# Patient Record
Sex: Male | Born: 1970 | Race: White | Hispanic: No | Marital: Married | Smoking: Current every day smoker
Health system: Southern US, Community
[De-identification: ages and names within clinical notes are randomized; demographics above are authoritative.]

## PROBLEM LIST (undated history)

## (undated) DIAGNOSIS — F32A Depression, unspecified: Secondary | ICD-10-CM

## (undated) DIAGNOSIS — T148XXA Other injury of unspecified body region, initial encounter: Secondary | ICD-10-CM

## (undated) DIAGNOSIS — F419 Anxiety disorder, unspecified: Secondary | ICD-10-CM

## (undated) DIAGNOSIS — F329 Major depressive disorder, single episode, unspecified: Secondary | ICD-10-CM

## (undated) DIAGNOSIS — H548 Legal blindness, as defined in USA: Secondary | ICD-10-CM

## (undated) HISTORY — PX: ABDOMINAL SURGERY: SHX537

---

## 2005-05-26 ENCOUNTER — Emergency Department (HOSPITAL_COMMUNITY): Admission: EM | Admit: 2005-05-26 | Discharge: 2005-05-26 | Payer: Self-pay | Admitting: *Deleted

## 2008-12-26 ENCOUNTER — Emergency Department (HOSPITAL_COMMUNITY): Admission: EM | Admit: 2008-12-26 | Discharge: 2008-12-26 | Payer: Self-pay | Admitting: Emergency Medicine

## 2009-01-06 ENCOUNTER — Emergency Department (HOSPITAL_COMMUNITY): Admission: EM | Admit: 2009-01-06 | Discharge: 2009-01-06 | Payer: Self-pay | Admitting: Emergency Medicine

## 2009-02-03 ENCOUNTER — Emergency Department (HOSPITAL_COMMUNITY): Admission: EM | Admit: 2009-02-03 | Discharge: 2009-02-03 | Payer: Self-pay | Admitting: Emergency Medicine

## 2010-02-02 ENCOUNTER — Ambulatory Visit: Payer: Self-pay | Admitting: Cardiology

## 2013-10-27 ENCOUNTER — Encounter (HOSPITAL_COMMUNITY): Payer: Self-pay | Admitting: Emergency Medicine

## 2013-10-27 ENCOUNTER — Ambulatory Visit (HOSPITAL_COMMUNITY)
Admission: RE | Admit: 2013-10-27 | Discharge: 2013-10-27 | Disposition: A | Discharge status: Home / Self Care | Payer: Medicaid Other | Source: Ambulatory Visit | Attending: *Deleted | Admitting: *Deleted

## 2013-10-27 ENCOUNTER — Other Ambulatory Visit (HOSPITAL_COMMUNITY): Payer: Self-pay | Admitting: *Deleted

## 2013-10-27 ENCOUNTER — Emergency Department (HOSPITAL_COMMUNITY)
Admission: EM | Admit: 2013-10-27 | Discharge: 2013-10-27 | Disposition: A | Discharge status: Home / Self Care | Payer: Medicaid Other | Attending: Emergency Medicine | Admitting: Emergency Medicine

## 2013-10-27 DIAGNOSIS — M51379 Other intervertebral disc degeneration, lumbosacral region without mention of lumbar back pain or lower extremity pain: Secondary | ICD-10-CM | POA: Insufficient documentation

## 2013-10-27 DIAGNOSIS — F17200 Nicotine dependence, unspecified, uncomplicated: Secondary | ICD-10-CM | POA: Insufficient documentation

## 2013-10-27 DIAGNOSIS — M545 Low back pain, unspecified: Secondary | ICD-10-CM | POA: Insufficient documentation

## 2013-10-27 DIAGNOSIS — Z8659 Personal history of other mental and behavioral disorders: Secondary | ICD-10-CM | POA: Insufficient documentation

## 2013-10-27 DIAGNOSIS — S0003XA Contusion of scalp, initial encounter: Secondary | ICD-10-CM | POA: Insufficient documentation

## 2013-10-27 DIAGNOSIS — M25559 Pain in unspecified hip: Secondary | ICD-10-CM

## 2013-10-27 DIAGNOSIS — S0083XA Contusion of other part of head, initial encounter: Secondary | ICD-10-CM | POA: Insufficient documentation

## 2013-10-27 DIAGNOSIS — S1093XA Contusion of unspecified part of neck, initial encounter: Principal | ICD-10-CM

## 2013-10-27 DIAGNOSIS — M5137 Other intervertebral disc degeneration, lumbosacral region: Secondary | ICD-10-CM | POA: Insufficient documentation

## 2013-10-27 DIAGNOSIS — H548 Legal blindness, as defined in USA: Secondary | ICD-10-CM | POA: Insufficient documentation

## 2013-10-27 HISTORY — DX: Other injury of unspecified body region, initial encounter: T14.8XXA

## 2013-10-27 HISTORY — DX: Legal blindness, as defined in USA: H54.8

## 2013-10-27 HISTORY — DX: Depression, unspecified: F32.A

## 2013-10-27 HISTORY — DX: Major depressive disorder, single episode, unspecified: F32.9

## 2013-10-27 HISTORY — DX: Anxiety disorder, unspecified: F41.9

## 2013-10-27 MED ORDER — OXYCODONE-ACETAMINOPHEN 5-325 MG PO TABS: 1.0000 | ORAL_TABLET | ORAL | Status: AC | PRN
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
## 2013-10-27 NOTE — Discharge Instructions (Signed)
[facility] Primary Care Doctor List    Kari Baars MD. Specialty: Pulmonary Disease Contact information: [address]  [address]  [place] [address]  [phone]   Syliva Overman, MD. Specialty: [facility] Contact information: [address]  [place] [address]  [phone]   Lilyan Punt, MD. Specialty: Family Medicine Contact information: [address]  [place] [address]  [phone]   Avon Gully, MD Specialty: Internal Medicine Contact information: [address] [place] [address]  [phone]   Catalina Pizza, MD. Specialty: Internal Medicine Contact information: [address]  [place] [address]  [phone]   Butch Penny, MD. Specialty: Family Medicine Contact information: [address]  [place] [address]  [phone]   John Giovanni, MD. Specialty: Family Medicine Contact information: [address]  [address]  [place] [address]  [phone]   Carylon Perches, MD. Specialty: Internal Medicine Contact information: [address]  [address]  [place] [address]  [phone]    Emergency Department Resource Guide 1) Find a Doctor and Pay Out of Pocket Although you won't have to find out who is covered by your insurance plan, it is a good idea to ask around and get recommendations. You will then need to call the office and see if the doctor you have chosen will accept you as a new patient and what types of options they offer for patients who are self-pay. Some doctors offer discounts or will set up payment plans for their patients who do not have insurance, but you will need to ask so you aren't surprised when you get to your appointment.  2) Contact Your Local Health Department Not all health departments have doctors that can see patients for sick visits, but many do, so it is worth a call to see if yours does. If you don't know where your local health department is, you can  check in your phone book. The CDC also has a tool to help you locate your state's health department, and many state websites also have listings of all of their local health departments.  3) Find a Walk-in Clinic If your illness is not likely to be very severe or complicated, you may want to try a walk in clinic. These are popping up all over the country in pharmacies, drugstores, and shopping centers. They're usually staffed by nurse practitioners or physician assistants that have been trained to treat common illnesses and complaints. They're usually fairly quick and inexpensive. However, if you have serious medical issues or chronic medical problems, these are probably not your best option.  No Primary Care Doctor: - Call Health Connect at  [phone] - they can help you locate a primary care doctor that  accepts your insurance, provides certain services, etc. - Physician Referral Service- [phone]  Chronic Pain Problems: Organization         Address  Phone   Notes  [facility]  [phone] Patients need to be referred by their primary care doctor.   Medication Assistance: Organization         Address  Phone   Notes  [facility] Medication [facility] [address] [place] [place], [place] [address] [phone] --Must be a resident of [facility] -- Must have NO insurance coverage whatsoever (no Medicaid/ Medicare, etc.) -- The pt. MUST have a primary care doctor that directs their care regularly and follows them  in the community   MedAssist  [phone]   [employer]  [phone]) [phone]    Agencies that provide inexpensive medical care: Organization         Address  Phone   Notes  [facility]  [phone]) [phone]   [facility]    [phone]) [phone]   [facility] [address] [place], [place] [address] [phone]) [phone]   [facility] [address], [place] [phone]    Planned Parenthood    [phone]   [facility]    [phone]) [phone]   [facility]  [address], [place] Phone:  [phone], Fax:  [phone](336) [phone] Hours of Operation:  9 am - 6 pm, M-F.  Also accepts Medicaid/Medicare and self-pay.  [facility]  [address], [place] Phone: [phone](336) [phone], Fax: [phone]. Hours of Operation:  8:30 am - 5:30 pm, M-F.  Also accepts Medicaid and self-pay.  [facility] [place] [address], [place] Phone: [phone]) [phone]   [facility] [address], [place], [place] [phone], Ext. [phone] Mondays & Thursdays: 7-9 AM.  First 15 patients are seen on a first come, first serve basis.    Medicaid-accepting [facility] Providers:  Organization         Address  Phone   Notes  [facility] [address], [place] [phone]) [phone] Also accepts self-pay patients.  [facility] [address] [place], [place]  [phone]) [phone]   [facility] [address], [place] [phone](336) [phone]   [facility] [address] [place] [address], [place] [phone](336) [phone]   Renaye RakersVeita Bland [address], [place]   [phone]) [phone] Only accepts [place] Access [place]icaid patients after they have their name applied to their card.   Self-Pay (no insurance) in [facility]:  Organization         Address  Phone   Notes  Sickle Cell Patients, [facility] [address], [place] [phone](336) [phone]   [facility] [address], [place] [phone]) [phone]   [facility] [place]  [address],  [phone]   [facility]/Dr. Osei-Bonsu  [address] [place] [address], [place] or [phone], [place] [phone](336) [phone] Phone number for both [place] and [place] locations is the  same.  [facility] [address], [place] [phone]   [facility] [place] [facility] [address] [place] [address], [place] or [address] [phone]   [facility] [address], [place] [phone]) [phone], phone; [phone]) [phone], fax Sees patients 1st and 3rd Saturday of every month.  Must not qualify for public or private insurance (i.e. Medicaid, Medicare, [place] Health Choice, Veterans' Benefits)  Household income should be no more than 200% of the poverty level The clinic cannot treat you if you are pregnant or think you are pregnant  Sexually transmitted diseases are not treated at the clinic.    Dental Care: Organization         Address  Phone  Notes  [facility] Department of [facility] [address], [place] [phone]) [phone] Accepts children up to age 44 who are enrolled in  Medicaid or [place] Health Choice; pregnant women with a Medicaid card; and children who have applied for Medicaid or [place] Health Choice, but were declined, whose parents can pay a reduced fee at time of service.  [facility] Department of [facility] [place]  [address], [place] [phone] Accepts children up to age 221 who are enrolled in [place]icaid or [place] Health Choice; pregnant women with a Medicaid card; and children who have applied for Medicaid or [place] Health Choice, but were declined, whose parents can pay a reduced fee at time of service.  [facility]  [address], [place] [phone] Patients are seen by appointment only. Walk-ins are not accepted. [facility] will see patients 44 years of age and older. Monday - Tuesday (8am-5pm) Most Wednesdays (8:30-5pm) $30 per visit, cash only  [facility]  [address], [facility] [phone] Patients are seen by appointment only. Walk-ins are not accepted. [facility] will see patients  44 years of age and older. One Wednesday Evening (Monthly: Volunteer Based).  $30 per visit, cash only  [employer] [facility]  [phone] for adults; Children under age 354, call Graduate Pediatric Dentistry at [phone]. Children aged 814-14, please call [phone] to request a pediatric application.  Dental services are provided in all areas of dental care including fillings, crowns and bridges, complete and partial dentures, implants, gum treatment, root canals, and extractions. Preventive care is also provided. Treatment is provided to both adults and children. Patients are selected via a lottery and there is often a waiting list.   [facility] [address], [place]  [phone]) [phone] [email or web address]   [facility] [address], [place], [place] [phone], Ext. [phone] Second and Fourth Thursday of each month, opens at 6:30 AM; Clinic ends at 9 AM.  Patients are seen on a first-come first-served basis, and a limited number are seen during each clinic.   [facility]  [address], [place], [place] [phone](336) [phone]   Eligibility Requirements You must have lived in [place], [place], or [place] counties for at least the last three months.   You cannot be eligible for state or federal sponsored National Cityhealthcare insurance, including CIGNAVeterans Administration, [place]Medicaid, or [employer] EntertainmentMedicare.   You generally cannot be eligible for healthcare insurance through your employer.    How to apply: Eligibility screenings are held every Tuesday and Wednesday afternoon from 1:00 pm until 4:00 pm. You do not need an appointment for the interview!  [facility] [address], [place], [place] [phone]   [facility]  [phone]   [facility]  [phone]   [facility]  [phone]    Behavioral Health Resources in the Community: Intensive Outpatient  Programs Organization         Address  Phone  Notes  [facility] [address], [place], [place] [phone]   [facility] [address], [place], [place] [phone]   [facility] [address], [place], [place]  [phone]   [facility] [address],  [place], [place] [phone] or [phone]   Substance Abuse Resources Organization         Address  Phone  Notes  [facility] Drug Services  [phone]   [facility]  [phone]   The  [employer] [facility]  [phone]   [facility]  [phone]   Residential & Outpatient Substance Abuse Program  [phone]   Psychological Services Organization         Address  Phone  Notes  [facility]  [phone]   [facility]  [phone]- [phone]   [facility] [address], [place] [phone] or [phone]    Mobile Crisis Teams Organization         Address  Phone  Notes  [facility], Mobile Crisis Care Unit  [phone]   [facility]  [address] [place], [place] [phone]   Doristine LocksSharon DeEsch [address] [place] [phone]    Self-Help/Support Groups Organization         Address  Phone             Notes  [facility] of [place] - variety of support groups  [phone] Call for more information  Narcotics Anonymous (NA), Caring Services [address], [place]  2 meetings at this location   Statisticianesidential Treatment Programs Organization         Address  Phone  Notes  [facility] [address],    [place]  [phone]   [facility]  [address], [place] [phone], [place], [place] [phone]   [facility] [address], [place] [phone] Admissions: 8am-3pm M-F  [facility] [address]    [place], [place]  [phone]   [facility] [address], [place], [place] [phone]   [facility] [address]  [place], [place] [phone]   [facility] - Intensive Outpatient [address], [place], [place] [phone]   [facility] (Addiction Recovery Care [facility] [address]  [place], [place] [phone] or [phone]   [facility] Services (RTS) [address] [place], [place] [phone] Accepts Medicaid  [facility] [address]  [place] [phone] Substance Abuse/Addiction Treatment   [facility] Behavioral Health Resources Organization         Address  Phone  Notes  [facility]  [phone]) [phone]   Angie FavaJulie Brannon, PhD [address], [address] [place], [place]   [phone]) [phone] or [phone](336) [phone]   [facility]   [address] [place], [place] [phone](336) [phone]   [facility] [address], [place], [place] [phone]) [phone] Insurance/Medicaid/sponsorship through [facility]  Faith [facility] Families [address]                                    [place], [place] [phone]) [phone] Therapy/tele-psych/case  [facility] [address].   [place], [place] [phone]) [phone]    Dr. Lolly MustacheArfeen  [phone](336) [phone]   [facility] [place]  [employer] [facility] 1) [address], [place] 2) [address], [place] 3)  [address], [place] [phone](336) [phone](336) [phone]  [phone](336) [phone]   [facility] Child Abuse Hotline [phone](336) [phone] or [phone](336) [phone] (After Hours)
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
## 2013-11-01 NOTE — ED Provider Notes (Signed)
Medical screening examination/treatment/procedure(s) were performed by non-physician practitioner and as supervising physician I was immediately available for consultation/collaboration.   EKG Interpretation None        Agnes Brightbill W. Dene Landsberg, MD 11/01/13 1657 

## 2014-11-19 IMAGING — CR DG LUMBAR SPINE 2-3V
3 series · 3 of 3 positions shown · non-contrast
Comparison: None.

CLINICAL DATA: Lumbago.

EXAM:
LUMBAR SPINE - 2-3 VIEW

[view not recorded (1 of 3)]
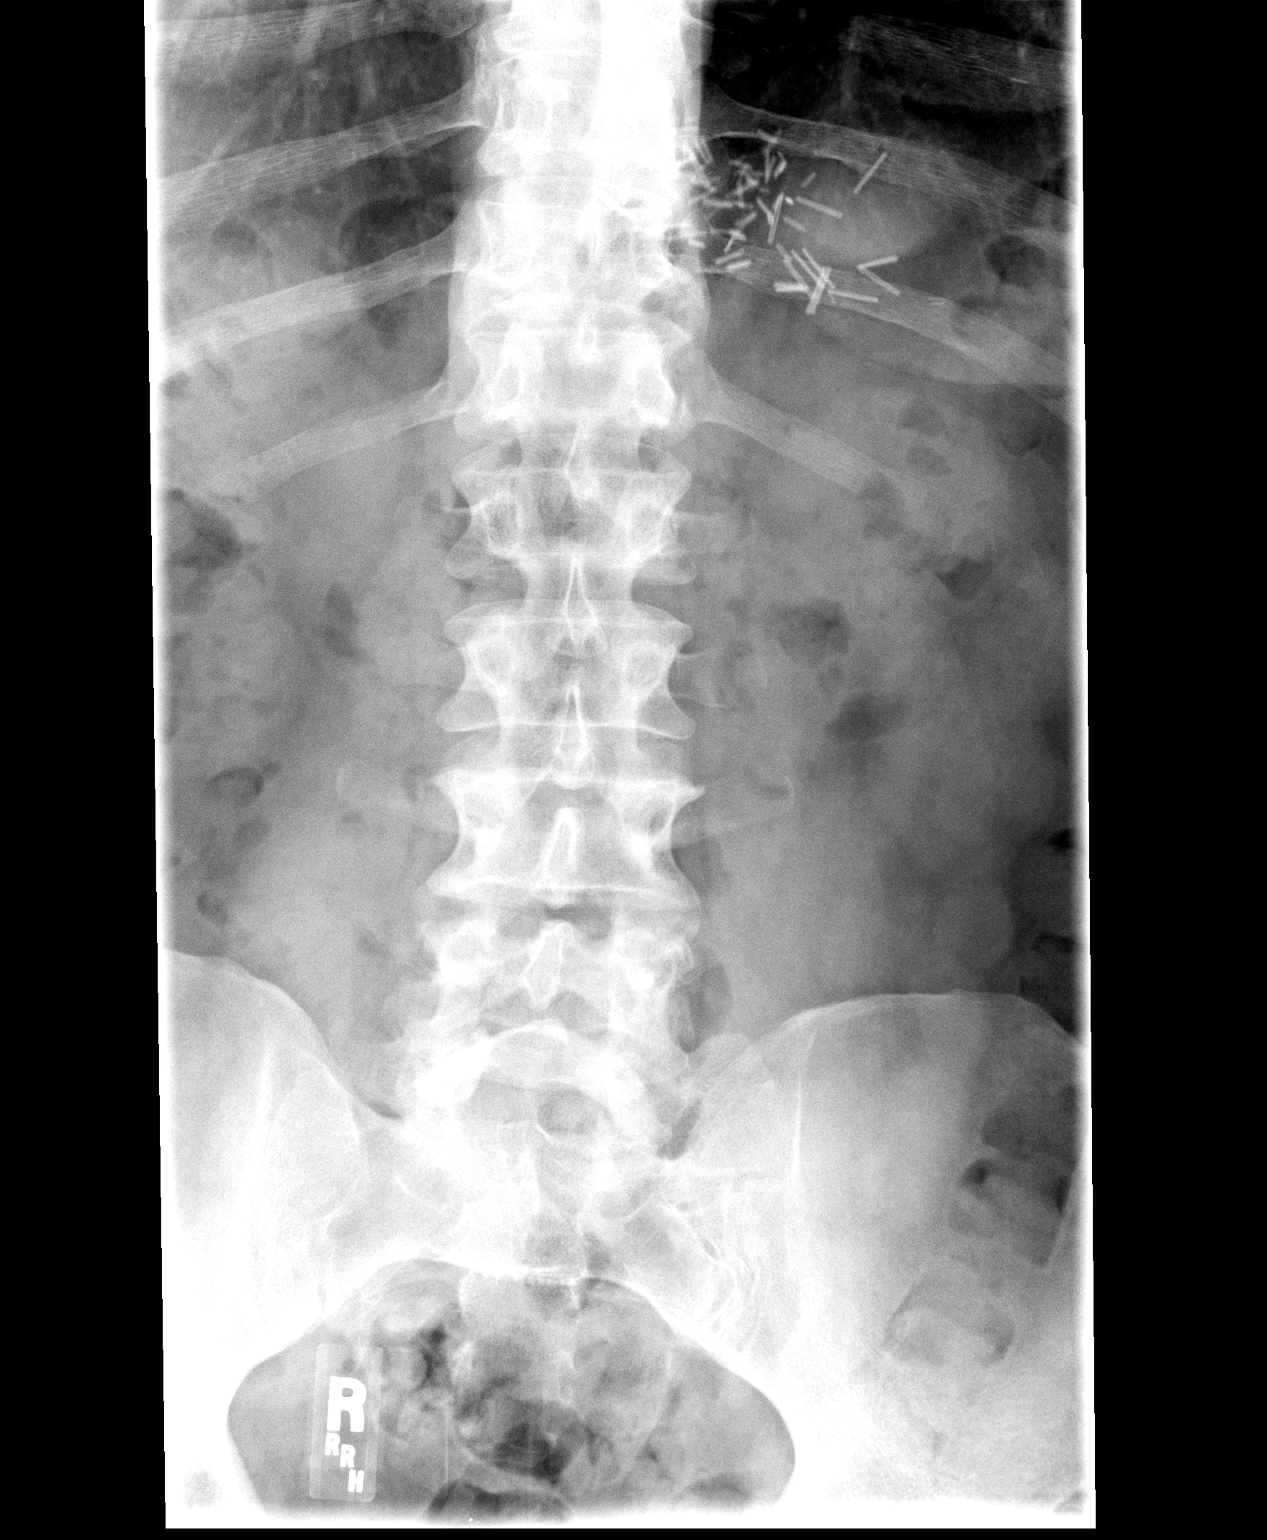

[view not recorded (2 of 3)]
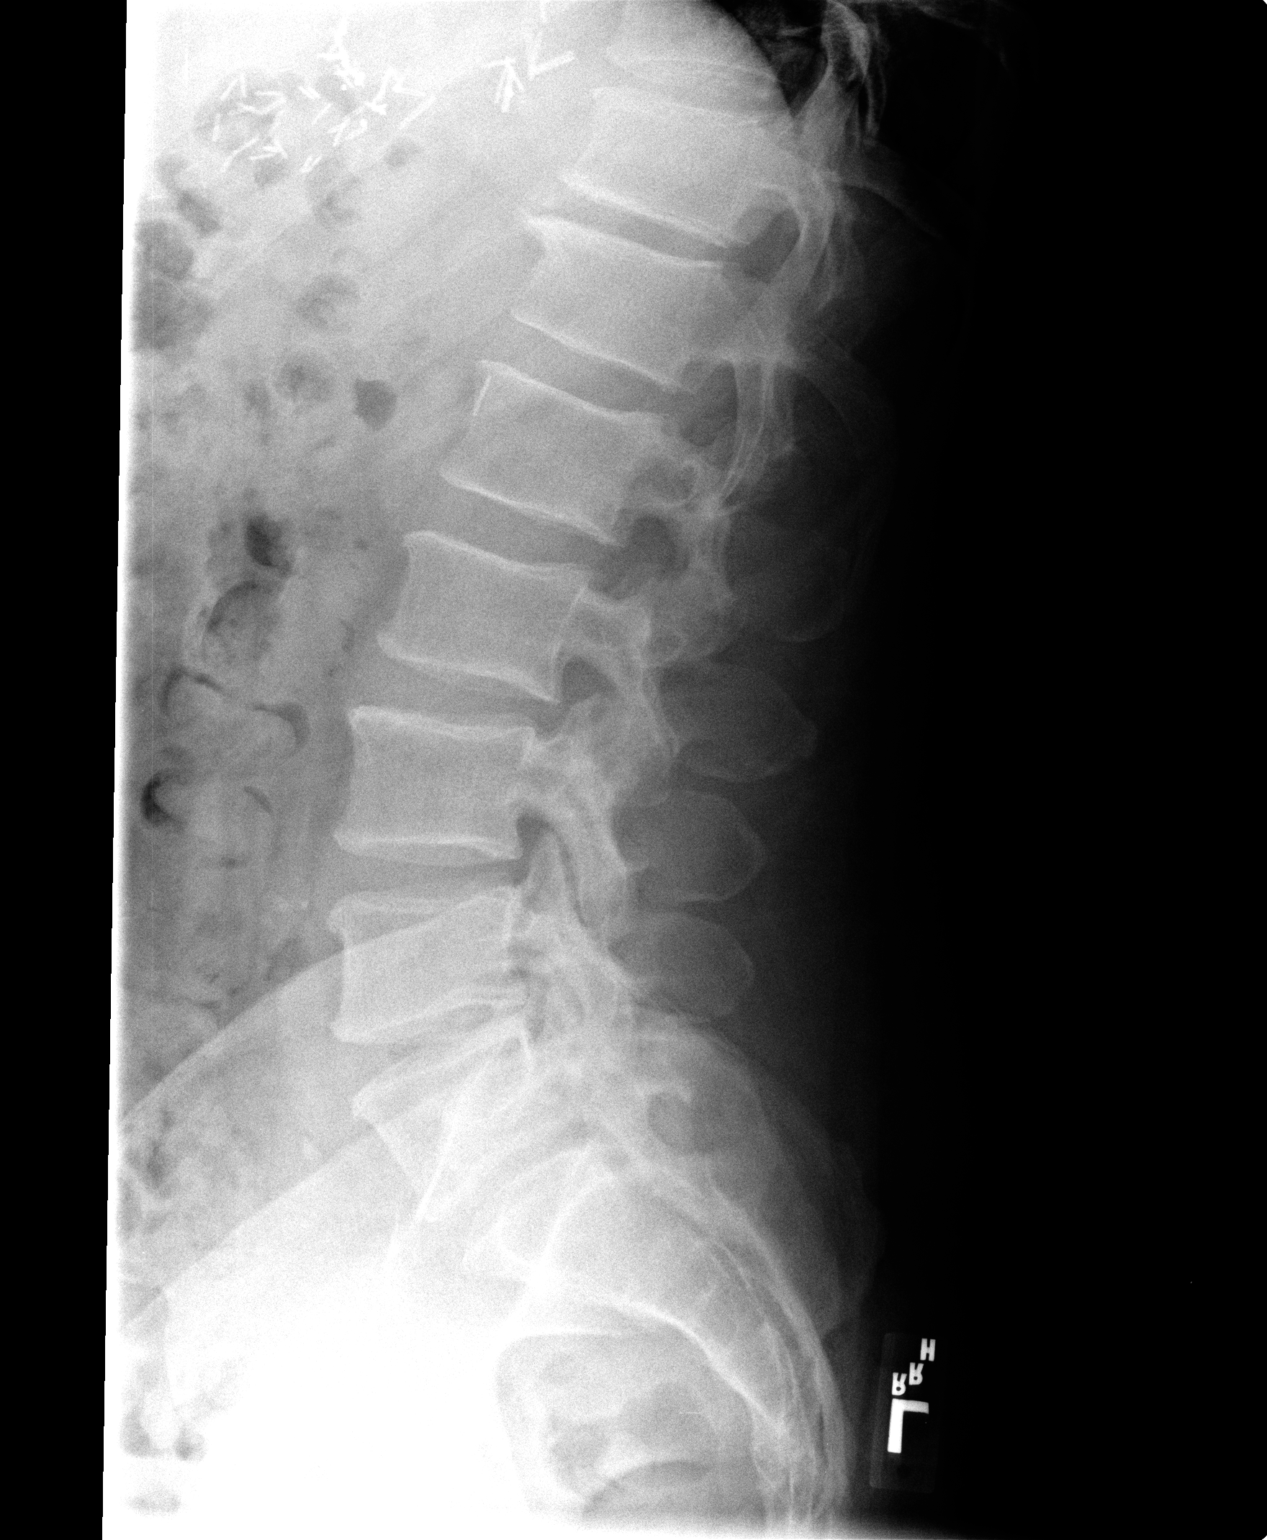

[view not recorded (3 of 3)]
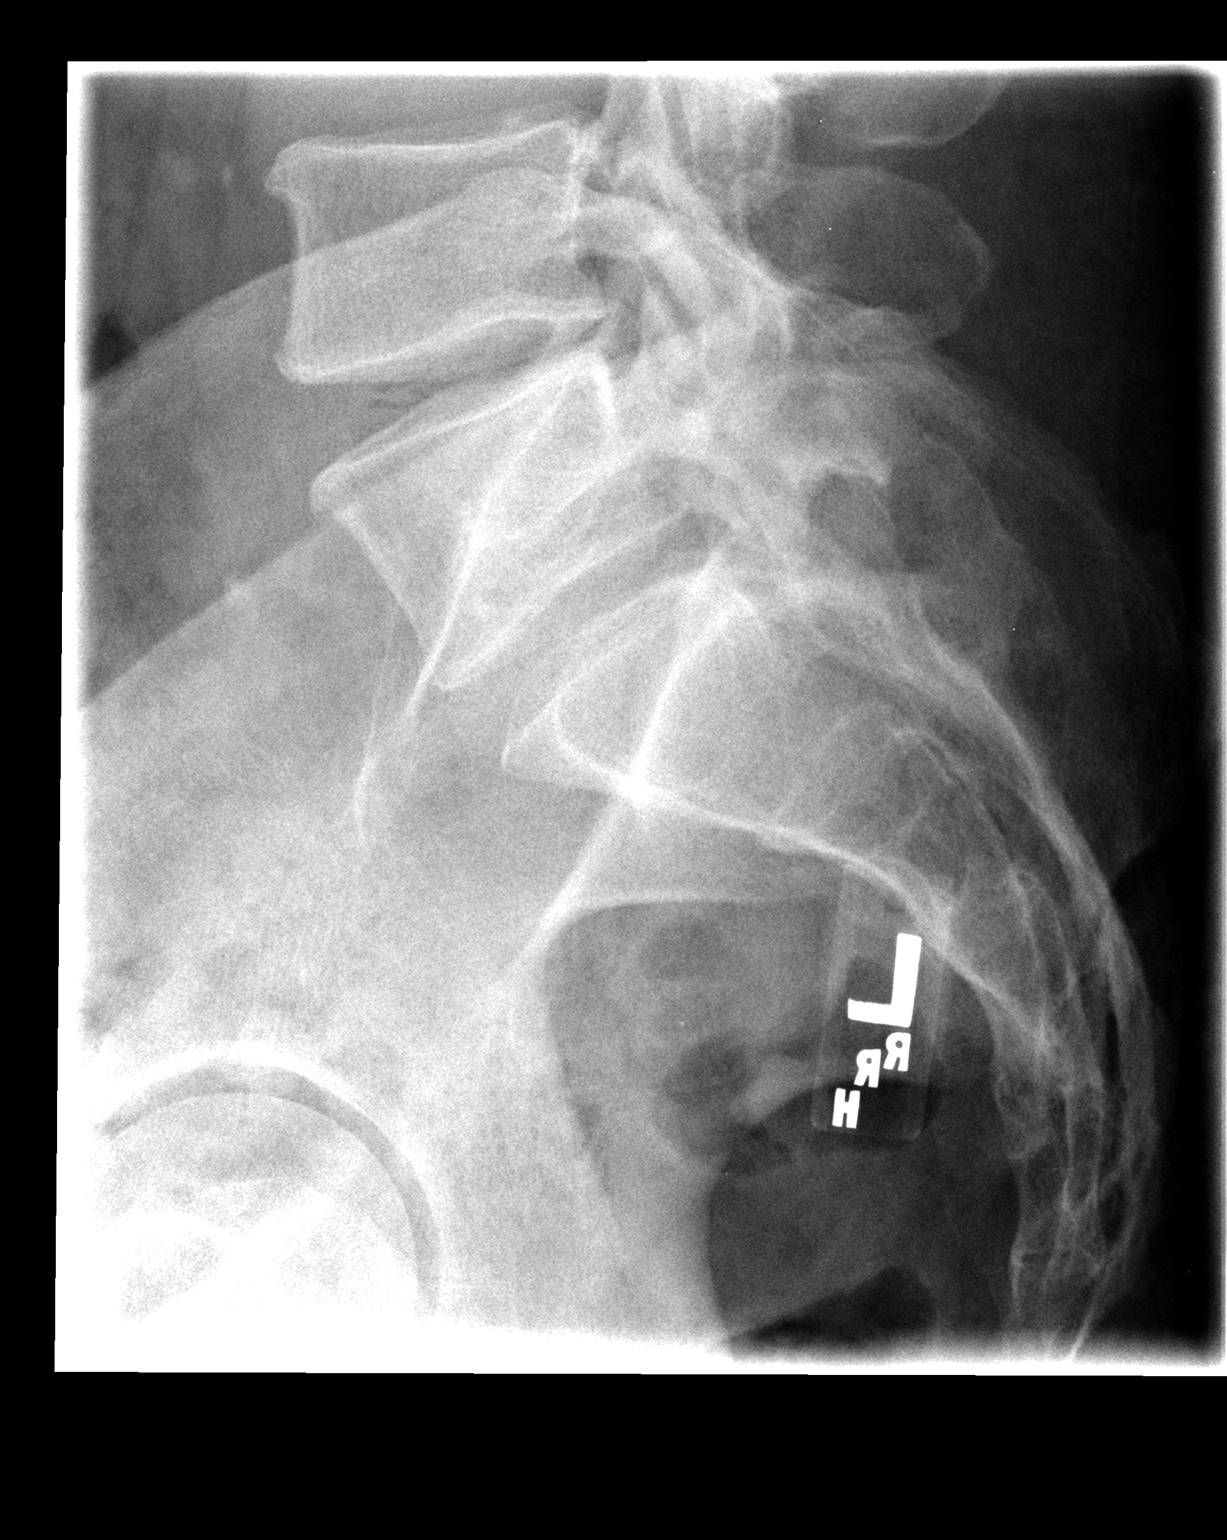

[3 of 3 positions shown; findings below may reference images not displayed]

FINDINGS: There is no evidence of lumbar spine fracture. Alignment is normal.
Mild degenerative disc disease is noted at L4-5.
IMPRESSION: Mild degenerative disc disease is noted at L4-5. No acute
abnormality seen in the lumbar spine.

## 2016-09-06 ENCOUNTER — Other Ambulatory Visit: Payer: Self-pay | Admitting: Specialist

## 2016-09-06 DIAGNOSIS — M5417 Radiculopathy, lumbosacral region: Secondary | ICD-10-CM

## 2016-09-11 ENCOUNTER — Other Ambulatory Visit (HOSPITAL_COMMUNITY): Payer: Self-pay | Admitting: Specialist

## 2016-09-11 DIAGNOSIS — M5417 Radiculopathy, lumbosacral region: Secondary | ICD-10-CM

## 2016-09-17 ENCOUNTER — Ambulatory Visit (HOSPITAL_COMMUNITY)
Admission: RE | Admit: 2016-09-17 | Discharge: 2016-09-17 | Disposition: A | Discharge status: Home / Self Care | Payer: Medicaid Other | Source: Ambulatory Visit | Attending: Specialist | Admitting: Specialist

## 2016-09-17 DIAGNOSIS — M5417 Radiculopathy, lumbosacral region: Secondary | ICD-10-CM | POA: Insufficient documentation

## 2016-09-17 DIAGNOSIS — M5126 Other intervertebral disc displacement, lumbar region: Secondary | ICD-10-CM | POA: Insufficient documentation

## 2016-09-17 DIAGNOSIS — M48061 Spinal stenosis, lumbar region without neurogenic claudication: Secondary | ICD-10-CM | POA: Insufficient documentation

## 2016-09-17 DIAGNOSIS — M5136 Other intervertebral disc degeneration, lumbar region: Secondary | ICD-10-CM | POA: Diagnosis not present

## 2017-03-01 DEATH — deceased

## 2017-10-10 IMAGING — MR MR LUMBAR SPINE W/O CM
4 of 5 series · 14 of 48 positions shown · non-contrast
Comparison: Prior radiograph from 08/20/2016.

CLINICAL DATA: Initial evaluation for recent fall, tingling and
right lower extremity.

EXAM:
MRI LUMBAR SPINE WITHOUT CONTRAST
TECHNIQUE: Multiplanar, multisequence MR imaging of the lumbar spine was
performed. No intravenous contrast was administered.

[Series 3: T2 · sagittal · 4.0mm · 0.78mm/px · 5 of 15 slices shown (1 of 2)]
[im 1/15]
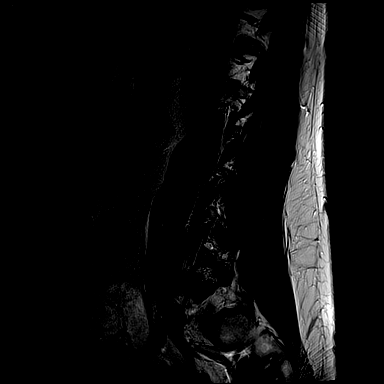
[im 4/15]
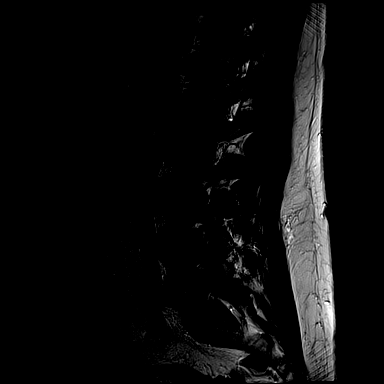
[im 8/15]
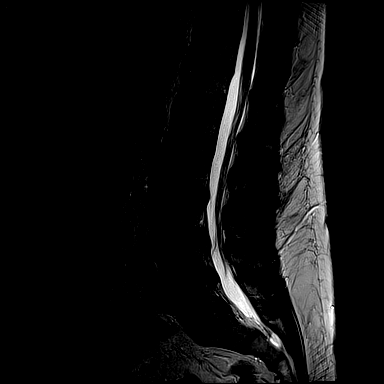
[im 11/15]
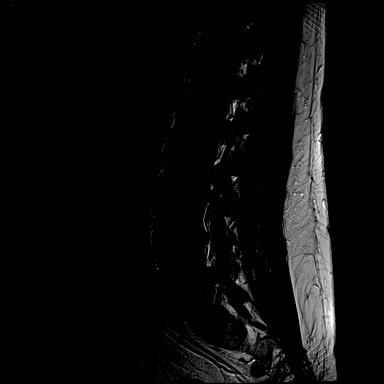
[im 15/15]
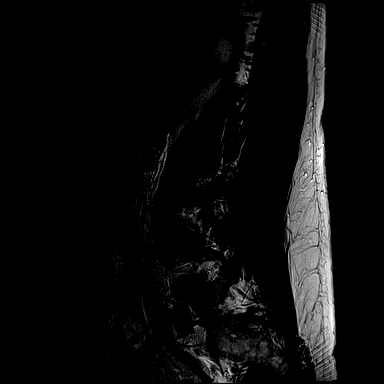

[Series 4: T1 · sagittal · 4.0mm · 0.39mm/px · 3 of 15 slices shown (1 of 2)]
[im 3/15]
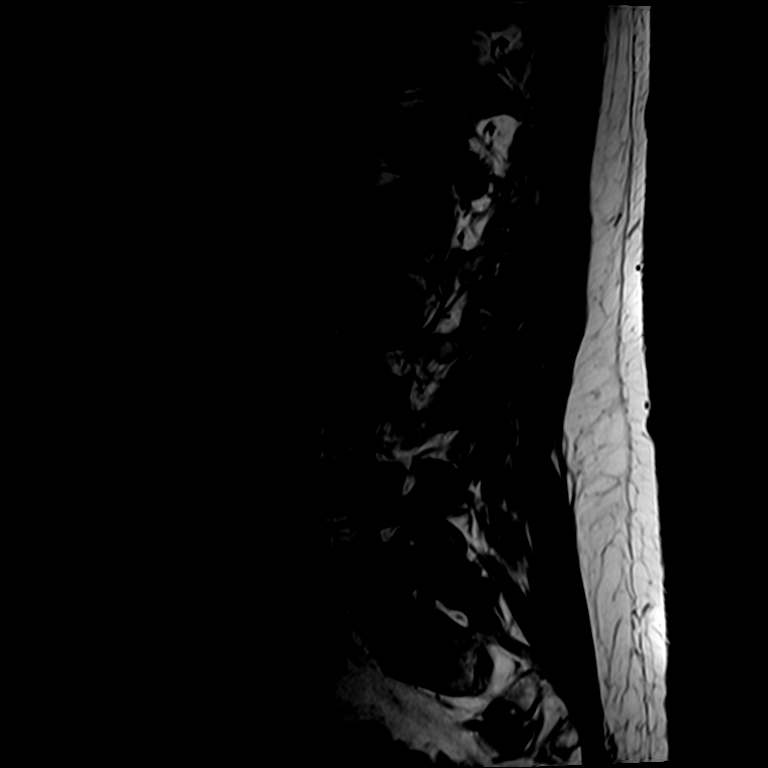
[im 9/15]
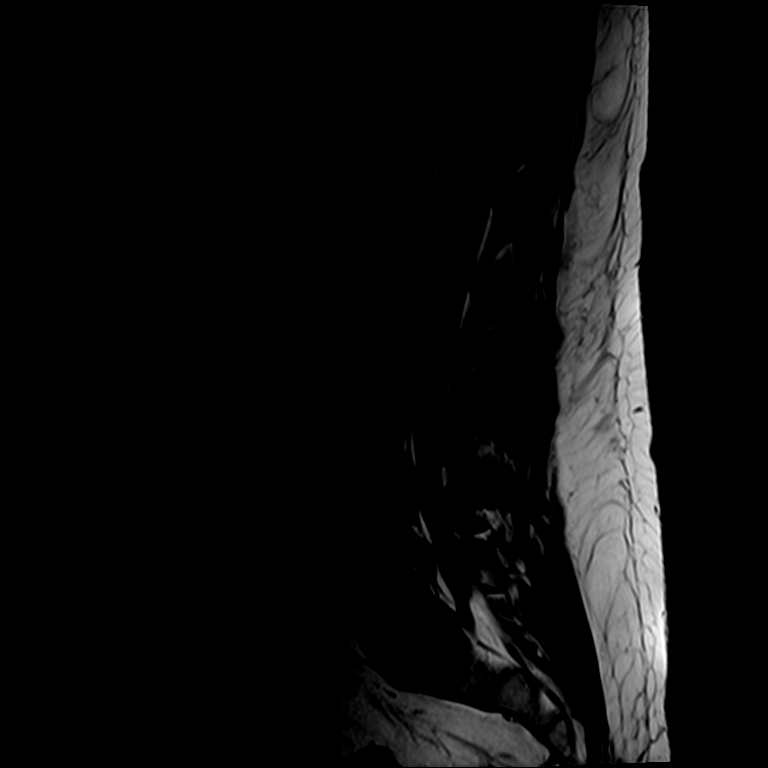
[im 15/15]
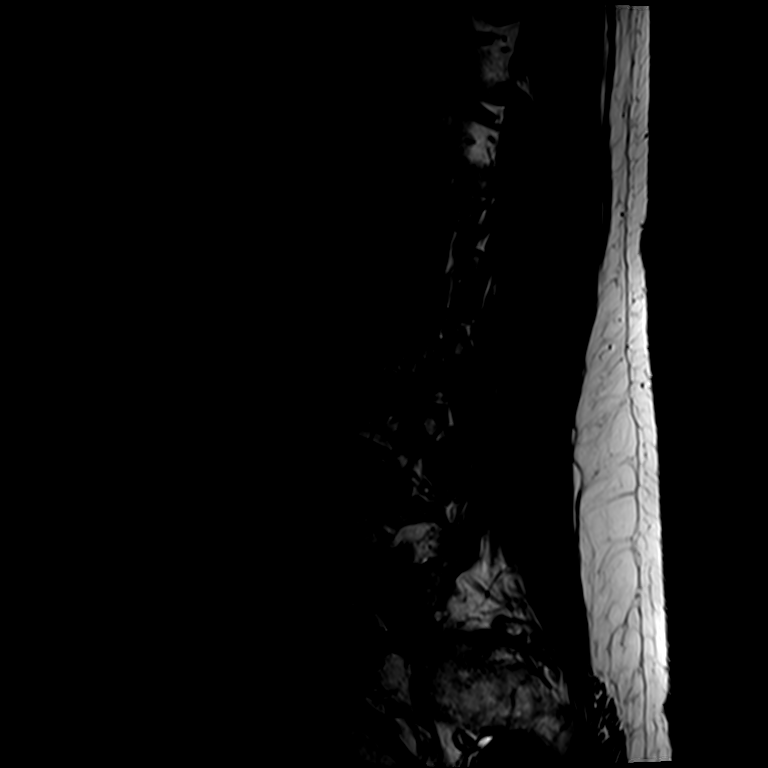

[Series 8: T2 · axial · 4.0mm · 0.22mm/px · z∈[-200,-65]mm · 3 of 38 slices shown (2 of 2)]
[im 6/38]
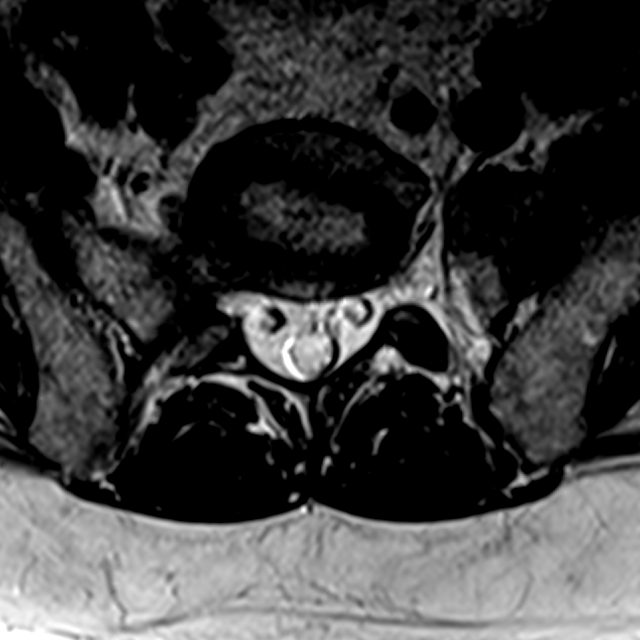
[im 19/38]
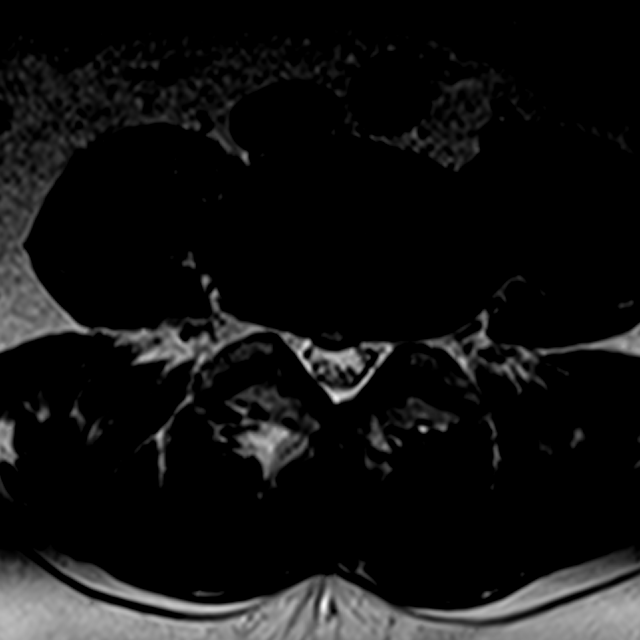
[im 32/38]
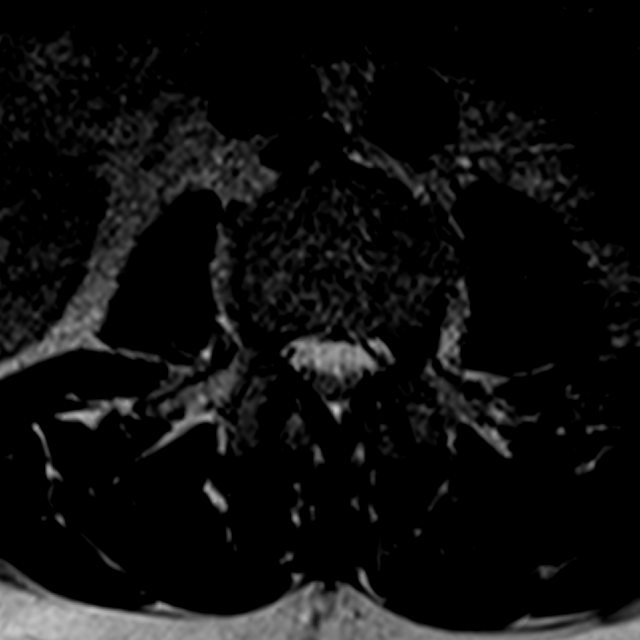

[Series 9: T1 · axial · 4.0mm · 0.22mm/px · z∈[-198,-65]mm · 3 of 38 slices shown (2 of 2)]
[im 6/38]
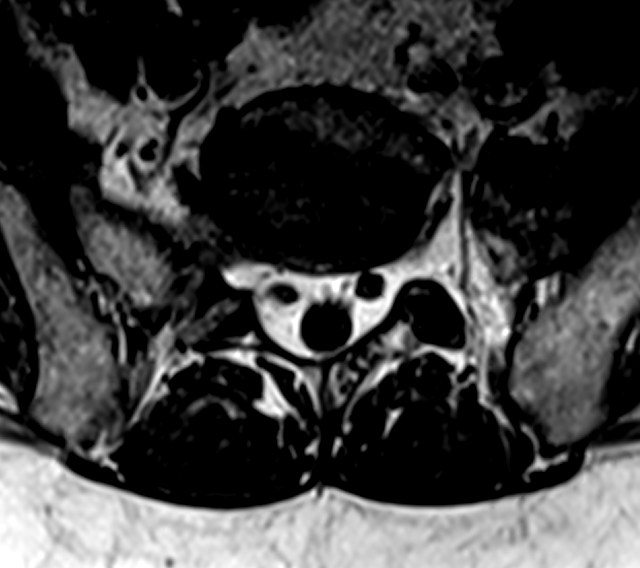
[im 19/38]
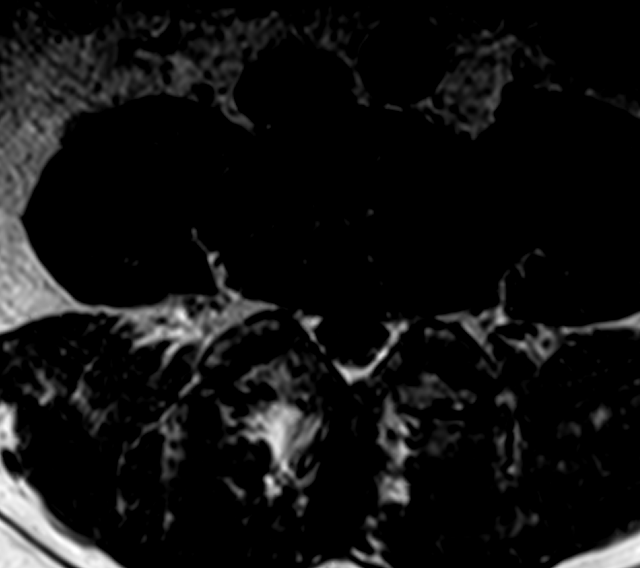
[im 32/38]
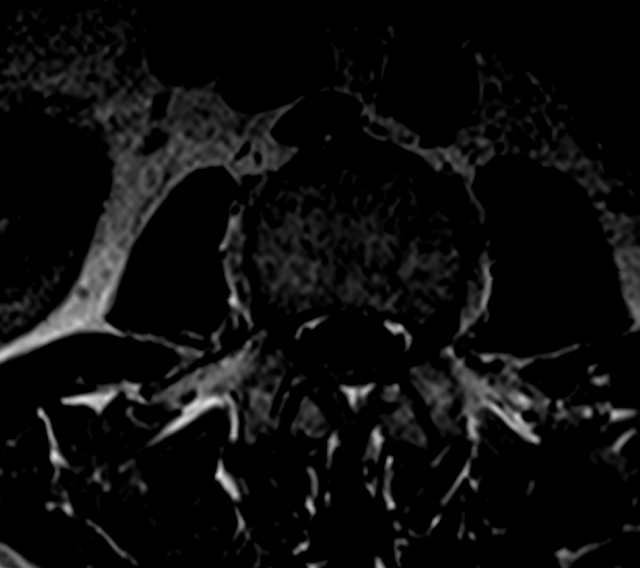

[14 of 48 positions shown; findings below may reference images not displayed]

FINDINGS: Segmentation: Transitional lumbosacral anatomy with partial
sacralization of the L5 vertebral body.

Alignment: Vertebral bodies normally aligned with preservation of
the normal lumbar lordosis. No listhesis.

Vertebrae: Vertebral body heights are maintained. No evidence for
acute or chronic fracture. Signal intensity within the vertebral
body bone marrow is within normal limits. No discrete osseous
lesions.

Conus medullaris: Extends to the T12 level and appears normal.

Paraspinal and other soft tissues: Paraspinous soft tissues within
normal limits.

Disc levels:

T11-12: Mild diffuse degenerative disc bulge with disc desiccation.
No stenosis.

T12-L1:  Unremarkable.

L1-2:  Unremarkable.

L2-3: Diffuse degenerative disc bulge with disc desiccation. No
focal disc protrusion. Mild facet and ligamentum flavum hypertrophy.
No significant canal stenosis. Mild left foraminal stenosis.

L3-4: Diffuse degenerative disc bulge with disc desiccation. Small
central/ right subarticular disc extrusion with inferior migration
(series 3, image 8). Extruded disc encroaches upon the right lateral
recess, potentially irritating the transiting right L4 nerve root.
No significant canal stenosis. Mild facet and ligamentum flavum
hypertrophy. Mild bilateral foraminal stenosis, slightly worse than
left.

L4-5: Diffuse degenerative disc bulge with disc desiccation.
Superimposed central disc protrusion with slight inferior migration.
Protruding disc encroaches upon the lateral recesses bilaterally,
greater on the right. This could potentially effect either of the L5
nerve roots. No significant canal stenosis. Mild to moderate
bilateral foraminal stenosis, slightly worse on the right.

L5-S1: Transitional lumbosacral anatomy with somewhat rudimentary
L5-S1 disc. No disc bulge or disc protrusion. Mild bilateral facet
arthrosis. No stenosis.
IMPRESSION: 1. Central/right subarticular disc extrusion at L3-4, encroaching
upon the right lateral recess and potentially affecting the
descending right L4 nerve root.
2. Central disc protrusion at L4-5, encroaching upon the lateral
recesses and potentially affecting either the descending and L5
nerve roots.
3. Degenerative disc bulging at L2-3 with resultant mild left
foraminal stenosis.
4. Transitional lumbosacral anatomy. Careful correlation with
numbering system on this exam recommended prior to any potential
future surgical intervention.
